# Patient Record
Sex: Male | Born: 1970 | Race: White | Hispanic: No | Marital: Single | Smoking: Never smoker
Health system: Southern US, Community
[De-identification: ages and names within clinical notes are randomized; demographics above are authoritative.]

---

## 2016-12-17 ENCOUNTER — Encounter (HOSPITAL_COMMUNITY): Payer: Self-pay | Admitting: Emergency Medicine

## 2016-12-17 ENCOUNTER — Observation Stay (HOSPITAL_COMMUNITY): Payer: Self-pay

## 2016-12-17 ENCOUNTER — Inpatient Hospital Stay (HOSPITAL_COMMUNITY)
Admission: EM | Admit: 2016-12-17 | Discharge: 2016-12-18 | DRG: 917 | Discharge status: Against Medical Advice | Payer: Self-pay | Attending: Pulmonary Disease | Admitting: Pulmonary Disease

## 2016-12-17 ENCOUNTER — Emergency Department (HOSPITAL_COMMUNITY): Payer: Self-pay

## 2016-12-17 DIAGNOSIS — T402X1A Poisoning by other opioids, accidental (unintentional), initial encounter: Secondary | ICD-10-CM

## 2016-12-17 DIAGNOSIS — R0681 Apnea, not elsewhere classified: Secondary | ICD-10-CM | POA: Diagnosis present

## 2016-12-17 DIAGNOSIS — T40601A Poisoning by unspecified narcotics, accidental (unintentional), initial encounter: Secondary | ICD-10-CM | POA: Diagnosis present

## 2016-12-17 DIAGNOSIS — N289 Disorder of kidney and ureter, unspecified: Secondary | ICD-10-CM | POA: Diagnosis present

## 2016-12-17 DIAGNOSIS — G92 Toxic encephalopathy: Secondary | ICD-10-CM | POA: Diagnosis present

## 2016-12-17 DIAGNOSIS — F1010 Alcohol abuse, uncomplicated: Secondary | ICD-10-CM | POA: Diagnosis present

## 2016-12-17 DIAGNOSIS — R739 Hyperglycemia, unspecified: Secondary | ICD-10-CM | POA: Diagnosis present

## 2016-12-17 DIAGNOSIS — T401X1A Poisoning by heroin, accidental (unintentional), initial encounter: Principal | ICD-10-CM | POA: Diagnosis present

## 2016-12-17 LAB — RAPID URINE DRUG SCREEN, HOSP PERFORMED
Amphetamines: POSITIVE — AB
BENZODIAZEPINES: NOT DETECTED
Barbiturates: NOT DETECTED
COCAINE: POSITIVE — AB
OPIATES: POSITIVE — AB
Tetrahydrocannabinol: NOT DETECTED

## 2016-12-17 LAB — COMPREHENSIVE METABOLIC PANEL
ALBUMIN: 3.8 g/dL (ref 3.5–5.0)
ALT: 262 U/L — AB (ref 17–63)
AST: 414 U/L — AB (ref 15–41)
Alkaline Phosphatase: 86 U/L (ref 38–126)
Anion gap: 15 (ref 5–15)
BUN: 9 mg/dL (ref 6–20)
CHLORIDE: 98 mmol/L — AB (ref 101–111)
CO2: 22 mmol/L (ref 22–32)
CREATININE: 1.32 mg/dL — AB (ref 0.61–1.24)
Calcium: 8.7 mg/dL — ABNORMAL LOW (ref 8.9–10.3)
GFR calc Af Amer: >60 mL/min (ref >60)
GLUCOSE: 203 mg/dL — AB (ref 65–99)
POTASSIUM: 3.1 mmol/L — AB (ref 3.5–5.1)
Sodium: 135 mmol/L (ref 135–145)
Total Bilirubin: 0.4 mg/dL (ref 0.3–1.2)
Total Protein: 7 g/dL (ref 6.5–8.1)

## 2016-12-17 LAB — GLUCOSE, CAPILLARY
GLUCOSE-CAPILLARY: 97 mg/dL (ref 65–99)
GLUCOSE-CAPILLARY: 102 mg/dL — AB (ref 65–99)
GLUCOSE-CAPILLARY: 123 mg/dL — AB (ref 65–99)
Glucose-Capillary: 167 mg/dL — ABNORMAL HIGH (ref 65–99)

## 2016-12-17 LAB — CBC WITH DIFFERENTIAL/PLATELET
BASOS ABS: 0 10*3/uL (ref 0.0–0.1)
BASOS PCT: 0 %
EOS PCT: 0 %
Eosinophils Absolute: 0.1 10*3/uL (ref 0.0–0.7)
HEMATOCRIT: 49.9 % (ref 39.0–52.0)
Hemoglobin: 16.7 g/dL (ref 13.0–17.0)
LYMPHS PCT: 7 %
Lymphs Abs: 1.2 10*3/uL (ref 0.7–4.0)
MCH: 32.6 pg (ref 26.0–34.0)
MCHC: 33.5 g/dL (ref 30.0–36.0)
MCV: 97.3 fL (ref 78.0–100.0)
Monocytes Absolute: 0.4 10*3/uL (ref 0.1–1.0)
Monocytes Relative: 3 %
NEUTROS ABS: 15.1 10*3/uL — AB (ref 1.7–7.7)
Neutrophils Relative %: 90 %
PLATELETS: 381 10*3/uL (ref 150–400)
RBC: 5.13 MIL/uL (ref 4.22–5.81)
RDW: 12.7 % (ref 11.5–15.5)
WBC: 16.8 10*3/uL — AB (ref 4.0–10.5)

## 2016-12-17 LAB — I-STAT CHEM 8, ED
BUN: 9 mg/dL (ref 6–20)
CREATININE: 1.2 mg/dL (ref 0.61–1.24)
Calcium, Ion: 1.07 mmol/L — ABNORMAL LOW (ref 1.15–1.40)
Chloride: 99 mmol/L — ABNORMAL LOW (ref 101–111)
Glucose, Bld: 221 mg/dL — ABNORMAL HIGH (ref 65–99)
HEMATOCRIT: 53 % — AB (ref 39.0–52.0)
Hemoglobin: 18 g/dL — ABNORMAL HIGH (ref 13.0–17.0)
POTASSIUM: 3.4 mmol/L — AB (ref 3.5–5.1)
SODIUM: 139 mmol/L (ref 135–145)
TCO2: 26 mmol/L (ref 22–32)

## 2016-12-17 LAB — CBC
HEMATOCRIT: 48 % (ref 39.0–52.0)
HEMOGLOBIN: 16.5 g/dL (ref 13.0–17.0)
MCH: 33.1 pg (ref 26.0–34.0)
MCHC: 34.4 g/dL (ref 30.0–36.0)
MCV: 96.2 fL (ref 78.0–100.0)
Platelets: 282 10*3/uL (ref 150–400)
RBC: 4.99 MIL/uL (ref 4.22–5.81)
RDW: 12.8 % (ref 11.5–15.5)
WBC: 14.9 10*3/uL — ABNORMAL HIGH (ref 4.0–10.5)

## 2016-12-17 LAB — CREATININE, SERUM
Creatinine, Ser: 1.1 mg/dL (ref 0.61–1.24)
GFR calc Af Amer: >60 mL/min (ref >60)
GFR calc non Af Amer: >60 mL/min (ref >60)

## 2016-12-17 LAB — TROPONIN I
TROPONIN I: 0.05 ng/mL — AB (ref <0.03)
Troponin I: 0.05 ng/mL (ref <0.03)
Troponin I: 0.04 ng/mL (ref <0.03)

## 2016-12-17 LAB — ACETAMINOPHEN LEVEL: Acetaminophen (Tylenol), Serum: <10 ug/mL — ABNORMAL LOW (ref 10–30)

## 2016-12-17 LAB — CK: CK TOTAL: 282 U/L (ref 49–397)

## 2016-12-17 LAB — ETHANOL: Alcohol, Ethyl (B): 29 mg/dL — ABNORMAL HIGH (ref <10)

## 2016-12-17 LAB — SALICYLATE LEVEL

## 2016-12-17 MED ORDER — ENOXAPARIN SODIUM 40 MG/0.4ML ~~LOC~~ SOLN
40.0000 mg | Freq: Every day | SUBCUTANEOUS | Status: DC
  Administered 2016-12-17 – 2016-12-18 (×2): 40 mg via SUBCUTANEOUS
  Filled 2016-12-17 (×2): qty 0.4

## 2016-12-17 MED ORDER — SODIUM CHLORIDE 0.9 % IV SOLN: 250.0000 mL | INTRAVENOUS | Status: DC | PRN

## 2016-12-17 MED ORDER — THIAMINE HCL 100 MG/ML IJ SOLN
100.0000 mg | Freq: Every day | INTRAMUSCULAR | Status: DC
  Administered 2016-12-17: 100 mg via INTRAVENOUS
  Filled 2016-12-17: qty 1
  Filled 2016-12-17: qty 2

## 2016-12-17 MED ORDER — SODIUM CHLORIDE 0.9 % IV BOLUS (SEPSIS)
500.0000 mL | Freq: Once | INTRAVENOUS | Status: AC
  Administered 2016-12-17: 500 mL via INTRAVENOUS

## 2016-12-17 MED ORDER — ONDANSETRON HCL 4 MG/2ML IJ SOLN
INTRAMUSCULAR | Status: AC
  Filled 2016-12-17: qty 2

## 2016-12-17 MED ORDER — NALOXONE HCL 2 MG/2ML IJ SOSY
PREFILLED_SYRINGE | INTRAMUSCULAR | Status: AC
  Administered 2016-12-17: 06:00:00
  Filled 2016-12-17: qty 2

## 2016-12-17 MED ORDER — ONDANSETRON HCL 4 MG/2ML IJ SOLN: 4.0000 mg | Freq: Four times a day (QID) | INTRAMUSCULAR | Status: DC | PRN

## 2016-12-17 MED ORDER — POTASSIUM CHLORIDE 2 MEQ/ML IV SOLN
INTRAVENOUS | Status: DC
  Administered 2016-12-17 – 2016-12-18 (×2): via INTRAVENOUS
  Filled 2016-12-17 (×6): qty 1000

## 2016-12-17 MED ORDER — FAMOTIDINE IN NACL 20-0.9 MG/50ML-% IV SOLN
20.0000 mg | Freq: Two times a day (BID) | INTRAVENOUS | Status: DC
  Administered 2016-12-17 (×2): 20 mg via INTRAVENOUS
  Filled 2016-12-17 (×3): qty 50

## 2016-12-17 MED ORDER — NALOXONE HCL 0.4 MG/ML IJ SOLN
1.0000 mg/h | INTRAVENOUS | Status: DC
  Administered 2016-12-17: 1 mg/h via INTRAVENOUS
  Filled 2016-12-17: qty 10

## 2016-12-17 MED ORDER — INSULIN ASPART 100 UNIT/ML ~~LOC~~ SOLN
0.0000 [IU] | Freq: Three times a day (TID) | SUBCUTANEOUS | Status: DC
  Administered 2016-12-17: 2 [IU] via SUBCUTANEOUS

## 2016-12-17 MED ORDER — KETOROLAC TROMETHAMINE 30 MG/ML IJ SOLN
30.0000 mg | Freq: Once | INTRAMUSCULAR | Status: AC
  Administered 2016-12-17: 30 mg via INTRAVENOUS
  Filled 2016-12-17: qty 1

## 2016-12-17 NOTE — ED Triage Notes (Signed)
Pt arrives via EMS from home after overdose, friends called 911 after pt lost his pulses. Bystanders initiated CPR and PD continued. By the time EMS arrived pt had pulses was cyanotic and apneic. Quickly became alert and oriented. C/o sternal pain. Rhonchi lung sounds. Pt ambulatory to ED, alert at this time. Using bathroom.

## 2016-12-17 NOTE — ED Notes (Signed)
Patient placed on 2L [place].

## 2016-12-17 NOTE — ED Notes (Signed)
2mg Narcan given

## 2016-12-17 NOTE — H&P (Signed)
PULMONARY / CRITICAL CARE MEDICINE   Name: Brent Ortega MRN: [id number] DOB: [birth year]    ADMISSION DATE:  12/17/2016   CHIEF COMPLAINT: Decreased level of consciousness  HISTORY OF PRESENT ILLNESS:   This is a 46 year old who was snorting what he thought was heroin earlier today.  He became unresponsive and by report of bystanders pulseless.  Bystander CPR was performed.  He was still apneic when EMS arrived and he was given a total of 8 mg of Narcan and started on a Narcan infusion.  He is now awake and conversant.  He denies cough he denies chest pain.  He tells me that he is not a regular user and does not use needles.  He reports a concurrent use of approximately one 6 pack of beer per day.  PAST MEDICAL HISTORY :  He  has no past medical history on file.  He denies any significant past medical history PAST SURGICAL HISTORY: He  has no past surgical history on file.  He denies any previous surgeries  Not on File  No current facility-administered medications on file prior to encounter.    No current outpatient medications on file prior to encounter.    FAMILY HISTORY:  His has no family status information on file.    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  He denies smoking, he reports one 6 pack of beer per day, he says he is not a habitual user.    REVIEW OF SYSTEMS:   10 system review of systems is essentially negative, most notably he does not have a known history of glucose intolerance.  SUBJECTIVE:  As above  VITAL SIGNS: BP 114/72   Pulse 85   Resp 20   SpO2 95%     INTAKE / OUTPUT: No intake/output data recorded.  PHYSICAL EXAMINATION: General: This is a fit appearing 46 year old who is conversant with me.  Is suffering from nausea during my exam. Neuro: He is oriented x3, pupils are equal at 3 mm and EOMs are full, the face is symmetric.  He is moving all 4 spontaneously. HEENT: There is an old healed scar across  left face Cardiovascular: S1 and S2 are regular without murmur rub or gallop.  There is no JVD, there is no dependent edema. Lungs: Aspirations are unlabored, the lungs are clear Abdomen: The abdomen is soft without any organomegaly masses tenderness guarding or rebound  LABS:  BMET Recent Labs  Lab 12/17/16 0528  NA 139  K 3.4*  CL 99*  BUN 9  CREATININE 1.20  GLUCOSE 221*    Electrolytes No results for input(s): CALCIUM, MG, PHOS in the last 168 hours.  CBC Recent Labs  Lab 12/17/16 0528 12/17/16 0645  WBC  --  16.8*  HGB 18.0* 16.7  HCT 53.0* 49.9  PLT  --  381    Coag's No results for input(s): APTT, INR in the last 168 hours.  Sepsis Markers No results for input(s): LATICACIDVEN, PROCALCITON, O2SATVEN in the last 168 hours.  ABG No results for input(s): PHART, PCO2ART, PO2ART in the last 168 hours.  Liver Enzymes No results for input(s): AST, ALT, ALKPHOS, BILITOT, ALBUMIN in the last 168 hours.  Cardiac Enzymes No results for input(s): TROPONINI, PROBNP in the last 168 hours.  Glucose No results for input(s): GLUCAP in the last 168 hours.  Imaging Dg Chest 2 View  Result Date: 12/17/2016 CLINICAL DATA:  46 year old male status post CPR. EXAM: CHEST  2 VIEW  COMPARISON:  None. FINDINGS: The lungs are clear. There is no pleural effusion or pneumothorax. The cardiac silhouette is within normal limits. No acute osseous pathology. IMPRESSION: No active cardiopulmonary disease. Electronically Signed   By: Elgie CollardArash  Radparvar M.D.   On: 12/17/2016 06:28       DISCUSSION: This is a 46 year old who thought he was snorting heroin this morning.  He became unresponsive and CPR was performed by bystanders.  When EMS arrived apparently he had a pulse but was apneic and was given a total of 8 mg of Narcan in the field.  He awakened that has been maintained on a Narcan infusion.  Due to the co-ingestion of cocaine I will be obtaining a CT scan of the head as well as CT  or CK to rule out rhabdomyolysis.  Serial enzymes have been ordered  ASSESSMENT / PLAN:  PULMONARY A: Exam and chest x-ray do not suggest aspiration at the present time, we will remain vigilant for the possibility of a developing infiltrate.  CARDIOVASCULAR A: Tox screen indicates the co-ingestion of cocaine, there are no acute ischemic changes on the EKG, serial enzymes have been ordered.  ENDOCRINE A: He denies a history of glucose intolerance in the past but his glucose is elevated on his BMP today.  I have ordered an A1c and sliding scale insulin.  NEUROLOGIC A: At this point he appears to be neurologically intact however due to the co-ingestion of cocaine and altered mental status on presentation I have ordered a CT scan of the head.  I have decreased the rate of his Narcan infusion as he was appropriately interactive during my exam and suffering from nausea.   Greater than 32 minutes was spent in the care of this patient today  Pulmonary and Critical Care Medicine [facility] Pager: [phone]  12/17/2016, 8:21 AM

## 2016-12-17 NOTE — ED Provider Notes (Signed)
[facility] EMERGENCY DEPARTMENT Provider Note   CSN: [id number] Arrival date & time: 12/17/16  0446     History   Chief Complaint No chief complaint on file.   HPI Brent Ortega is a 46 y.o. male.  The history is provided by the patient. No language interpreter was used.  Ingestion  This is a new problem. The current episode started less than 1 hour ago. The problem occurs constantly. The problem has been resolved. Pertinent negatives include no abdominal pain, no headaches and no shortness of breath. Nothing aggravates the symptoms. Relieved by: 6 mg of narcan and bystander CPR. Treatments tried: narcan and Publishing copy. The treatment provided significant relief.    No past medical history on file.  There are no active problems to display for this patient.     Home Medications    Prior to Admission medications   Not on File    Family History No family history on file.  Social History Social History   Tobacco Use  . Smoking status: Not on file  Substance Use Topics  . Alcohol use: Not on file  . Drug use: Not on file     Allergies   Patient has no allergy information on record.   Review of Systems Review of Systems  Unable to perform ROS: Other (does not recall as was not responsive secondary to OD)  Respiratory: Negative for shortness of breath.   Gastrointestinal: Negative for abdominal pain.  Neurological: Negative for headaches.     Physical Exam Updated Vital Signs There were no vitals taken for this visit.  Physical Exam  Constitutional: He is oriented to person, place, and time. He appears well-developed and well-nourished. No distress.  HENT:  Head: Normocephalic and atraumatic.  Mouth/Throat: No oropharyngeal exudate.  Eyes: EOM are normal.  Pinpoint pupils   Neck: Normal range of motion. Neck supple. No JVD present.  Cardiovascular: Normal rate, regular rhythm, normal heart sounds and intact distal pulses.    Pulmonary/Chest: Effort normal and breath sounds normal. No respiratory distress. He has no rales.  Abdominal: Soft. Bowel sounds are normal. There is no tenderness.  Musculoskeletal: Normal range of motion.  Neurological: He is alert and oriented to person, place, and time. He displays normal reflexes.  Skin: Skin is warm. Capillary refill takes less than 2 seconds.  Psychiatric: His affect is angry.     ED Treatments / Results  Labs (all labs ordered are listed, but only abnormal results are displayed)  Results for orders placed or performed during the hospital encounter of 12/17/16  I-stat chem 8, ed  Result Value Ref Range   Sodium 139 135 - 145 mmol/L   Potassium 3.4 (L) 3.5 - 5.1 mmol/L   Chloride 99 (L) 101 - 111 mmol/L   BUN 9 6 - 20 mg/dL   Creatinine, Ser 4.09 0.61 - 1.24 mg/dL   Glucose, Bld 811 (H) 65 - 99 mg/dL   Calcium, Ion 9.14 (L) 1.15 - 1.40 mmol/L   TCO2 26 22 - 32 mmol/L   Hemoglobin 18.0 (H) 13.0 - 17.0 g/dL   HCT 78.2 (H) 95.6 - 21.3 %   No results found.  Radiology No results found.  Procedures Procedures (including critical care time)  Medications Ordered in ED Medications  ketorolac (TORADOL) 30 MG/ML injection 30 mg (not administered)  naloxone (NARCAN) 2 MG/2ML injection (  Given 12/17/16 0535)      Final Clinical Impressions(s) / ED Diagnoses   Patient  will not say what he ingested but given the amount of narcan he required at the scene to arouse and the amount in the ED necessary to keep the patient awake I suspect carfentanil ingestion.    He is now on a narcan drip and will require admission.    Signed out to Dr. Silverio LayYao.     Vannie Hochstetler, MD 12/17/16 [phone]

## 2016-12-17 NOTE — Progress Notes (Signed)
Dr. Molli KnockYacoub informed of lab values.

## 2016-12-17 NOTE — ED Notes (Signed)
Patient transported to CT 

## 2016-12-17 NOTE — ED Provider Notes (Signed)
  Physical Exam  BP 114/72   Pulse 85   Resp 20   SpO2 95%   Physical Exam  ED Course/Procedures     Procedures  MDM  Patient overdosed on heroin. Found altered and CPR started. Given 6 mg narcan by EMS and 2 mg in the ED. Now more sleepy. Pupils still small, minimally reactive. O2 88-89% on RA. Still protecting airway. Started on narcan drip. UDS + polysubstance and there is alcohol on board. Dr. Zollie BeckersWalter from critical care to admit.   CRITICAL CARE Performed by: Richardean Canalavid H Arhum Peeples   Total critical care time: 30 minutes  Critical care time was exclusive of separately billable procedures and treating other patients.  Critical care was necessary to treat or prevent imminent or life-threatening deterioration.  Critical care was time spent personally by me on the following activities: development of treatment plan with patient and/or surrogate as well as nursing, discussions with consultants, evaluation of patient's response to treatment, examination of patient, obtaining history from patient or surrogate, ordering and performing treatments and interventions, ordering and review of laboratory studies, ordering and review of radiographic studies, pulse oximetry and re-evaluation of patient's condition.       Charlynne PanderYao, Latoya Diskin Hsienta, MD 12/17/16 [phone]

## 2016-12-18 DIAGNOSIS — T40601D Poisoning by unspecified narcotics, accidental (unintentional), subsequent encounter: Secondary | ICD-10-CM

## 2016-12-18 LAB — BASIC METABOLIC PANEL WITH GFR
Anion gap: 5 (ref 5–15)
BUN: 15 mg/dL (ref 6–20)
CO2: 30 mmol/L (ref 22–32)
Calcium: 8.3 mg/dL — ABNORMAL LOW (ref 8.9–10.3)
Chloride: 102 mmol/L (ref 101–111)
Creatinine, Ser: 1.1 mg/dL (ref 0.61–1.24)
GFR calc Af Amer: >60 mL/min
GFR calc non Af Amer: >60 mL/min
Glucose, Bld: 110 mg/dL — ABNORMAL HIGH (ref 65–99)
Potassium: 3.9 mmol/L (ref 3.5–5.1)
Sodium: 137 mmol/L (ref 135–145)

## 2016-12-18 LAB — PHOSPHORUS: Phosphorus: 2.8 mg/dL (ref 2.5–4.6)

## 2016-12-18 LAB — GLUCOSE, CAPILLARY: Glucose-Capillary: 92 mg/dL (ref 65–99)

## 2016-12-18 LAB — CBC
HCT: 38.5 % — ABNORMAL LOW (ref 39.0–52.0)
Hemoglobin: 12.9 g/dL — ABNORMAL LOW (ref 13.0–17.0)
MCH: 32.7 pg (ref 26.0–34.0)
MCHC: 33.5 g/dL (ref 30.0–36.0)
MCV: 97.5 fL (ref 78.0–100.0)
Platelets: 255 10*3/uL (ref 150–400)
RBC: 3.95 MIL/uL — ABNORMAL LOW (ref 4.22–5.81)
RDW: 13.1 % (ref 11.5–15.5)
WBC: 13.6 10*3/uL — ABNORMAL HIGH (ref 4.0–10.5)

## 2016-12-18 LAB — HEMOGLOBIN A1C
Hgb A1c MFr Bld: 5.2 % (ref 4.8–5.6)
Mean Plasma Glucose: 102.54 mg/dL

## 2016-12-18 LAB — MRSA PCR SCREENING: MRSA BY PCR: NEGATIVE

## 2016-12-18 LAB — HIV ANTIBODY (ROUTINE TESTING W REFLEX): HIV SCREEN 4TH GENERATION: NONREACTIVE

## 2016-12-18 LAB — MAGNESIUM: Magnesium: 1.8 mg/dL (ref 1.7–2.4)

## 2016-12-18 MED ORDER — VITAMIN B-1 100 MG PO TABS
100.0000 mg | ORAL_TABLET | Freq: Every day | ORAL | Status: DC
  Administered 2016-12-18: 100 mg via ORAL
  Filled 2016-12-18: qty 1

## 2016-12-18 MED ORDER — FAMOTIDINE 20 MG PO TABS
20.0000 mg | ORAL_TABLET | Freq: Two times a day (BID) | ORAL | Status: DC
  Administered 2016-12-18: 20 mg via ORAL
  Filled 2016-12-18: qty 1

## 2016-12-18 MED ORDER — FOLIC ACID 5 MG/ML IJ SOLN
1.0000 mg | Freq: Every day | INTRAMUSCULAR | Status: DC
  Filled 2016-12-18: qty 0.2

## 2016-12-18 MED ORDER — FOLIC ACID 1 MG PO TABS
1.0000 mg | ORAL_TABLET | Freq: Every day | ORAL | Status: DC
  Administered 2016-12-18: 1 mg via ORAL
  Filled 2016-12-18: qty 1

## 2016-12-18 MED ORDER — LORAZEPAM 2 MG/ML IJ SOLN
1.0000 mg | Freq: Once | INTRAMUSCULAR | Status: AC
  Administered 2016-12-18: 1 mg via INTRAVENOUS
  Filled 2016-12-18: qty 1

## 2016-12-18 NOTE — Progress Notes (Signed)
PULMONARY / CRITICAL CARE MEDICINE   Name: Lucille PassyKevin M Mabee MRN: [id number] DOB: [birth year]    ADMISSION DATE:  12/17/2016   CHIEF COMPLAINT: Decreased level of consciousness  HISTORY OF PRESENT ILLNESS:   This is a 46 year old who was snorting what he thought was heroin earlier today.  He became unresponsive and by report of bystanders pulseless.  Bystander CPR was performed.  He was still apneic when EMS arrived and he was given a total of 8 mg of Narcan and started on a Narcan infusion.  He is now awake and conversant.  He denies cough he denies chest pain.  He tells me that he is not a regular user and does not use needles.  He reports a concurrent use of approximately one 6 pack of beer per day.   SUBJECTIVE:  Narcan weaned to off  VITAL SIGNS: BP 116/66   Pulse 95   Temp 99 F (37.2 C) (Oral)   Resp (!) 24   Wt 68.3 kg (150 lb 9.2 oz)   SpO2 95%     INTAKE / OUTPUT: I/O last 3 completed shifts: In: 3228.2 [P.O.:340; I.V.:2283.2; IV Piggyback:605] Out: -   PHYSICAL EXAMINATION: General: Awake, a bit drowsy but well-appearing Neuro: Well oriented, answers questions appropriately, no tremor, denies hallucinations, follows commands HEENT: Oropharynx clear, pupils equal Cardiovascular: Heart regular without any murmurs Lungs: Clear bilaterally, no wheezing, no crackles Abdomen:, Benign, positive bowel sounds  LABS:  BMET Recent Labs  Lab 12/17/16 0528 12/17/16 0645 12/17/16 1015 12/18/16 0448  NA 139 135  --  137  K 3.4* 3.1*  --  3.9  CL 99* 98*  --  102  CO2  --  22  --  30  BUN 9 9  --  15  CREATININE 1.20 1.32* 1.10 1.10  GLUCOSE 221* 203*  --  110*    Electrolytes Recent Labs  Lab 12/17/16 0645 12/18/16 0448  CALCIUM 8.7* 8.3*  MG  --  1.8  PHOS  --  2.8    CBC Recent Labs  Lab 12/17/16 0645 12/17/16 1015 12/18/16 0448  WBC 16.8* 14.9* 13.6*  HGB 16.7 16.5 12.9*  HCT 49.9 48.0 38.5*  PLT 381 282 255    Coag's No results for  input(s): APTT, INR in the last 168 hours.  Sepsis Markers No results for input(s): LATICACIDVEN, PROCALCITON, O2SATVEN in the last 168 hours.  ABG No results for input(s): PHART, PCO2ART, PO2ART in the last 168 hours.  Liver Enzymes Recent Labs  Lab 12/17/16 0645  AST 414*  ALT 262*  ALKPHOS 86  BILITOT 0.4  ALBUMIN 3.8    Cardiac Enzymes Recent Labs  Lab 12/17/16 1015 12/17/16 1332 12/17/16 1948  TROPONINI 0.05* 0.05* 0.04*    Glucose Recent Labs  Lab 12/17/16 1029 12/17/16 1121 12/17/16 1528 12/17/16 2011 12/18/16 1008  GLUCAP 97 102* 167* 123* 92    Imaging No results found.     DISCUSSION: This is a 46 year old who thought he was snorting heroin.  He became unresponsive and CPR was performed by bystanders.  When EMS arrived apparently he had a pulse but was apneic and was given a total of 8 mg of Narcan in the field.  He awakened that has been maintained on a Narcan infusion.  Also positive for cocaine  ASSESSMENT / PLAN:  PULMONARY A: No evidence current pneumonia Follow clinically, pulmonary hygiene.  RENAL: A: Acute renal insufficiency, suspect hypoperfusion, resolved Follow urine output, BMP  CARDIOVASCULAR A: No evidence  for acute MI  ENDOCRINE A: Hyperglycemia, resolved  NEUROLOGIC A: Acute encephalopathy due to substance use and intoxication History of alcohol abuse No longer under the effects of either the cocaine or his narcotics Follow closely for any evidence of alcohol withdrawal before discharge   Move to floor bed. Anticipate that he will d/c home 12/11  Levy Pupaobert Jamone Garrido, MD, PhD 12/18/2016, 1:44 PM [facility] [phone] or if no answer [phone]

## 2016-12-18 NOTE — Progress Notes (Signed)
Patient signed AMA, IV's taken out, and informed that if he leaves he will have to pay the entire bill. Patient is aware and competent to make decisions. Will notify the provider. Huntley EstelleLewis, Ritik Stavola E, RN 12/18/2016 2:35 PM

## 2018-09-23 ENCOUNTER — Encounter (HOSPITAL_COMMUNITY): Payer: Self-pay | Admitting: Emergency Medicine

## 2018-09-23 ENCOUNTER — Emergency Department (HOSPITAL_COMMUNITY)
Admission: EM | Admit: 2018-09-23 | Discharge: 2018-09-23 | Disposition: A | Discharge status: Home / Self Care | Payer: Self-pay | Attending: Emergency Medicine | Admitting: Emergency Medicine

## 2018-09-23 ENCOUNTER — Other Ambulatory Visit: Payer: Self-pay

## 2018-09-23 DIAGNOSIS — R55 Syncope and collapse: Secondary | ICD-10-CM | POA: Insufficient documentation

## 2018-09-23 DIAGNOSIS — R410 Disorientation, unspecified: Secondary | ICD-10-CM | POA: Insufficient documentation

## 2018-09-23 LAB — RAPID URINE DRUG SCREEN, HOSP PERFORMED
Amphetamines: POSITIVE — AB
Barbiturates: NOT DETECTED
Benzodiazepines: NOT DETECTED
Cocaine: NOT DETECTED
Opiates: NOT DETECTED
Tetrahydrocannabinol: NOT DETECTED

## 2018-09-23 LAB — BASIC METABOLIC PANEL
Anion gap: 10 (ref 5–15)
BUN: 23 mg/dL — ABNORMAL HIGH (ref 6–20)
CO2: 26 mmol/L (ref 22–32)
Calcium: 8.7 mg/dL — ABNORMAL LOW (ref 8.9–10.3)
Chloride: 103 mmol/L (ref 98–111)
Creatinine, Ser: 0.88 mg/dL (ref 0.61–1.24)
GFR calc Af Amer: >60 mL/min (ref >60)
GFR calc non Af Amer: >60 mL/min (ref >60)
Glucose, Bld: 94 mg/dL (ref 70–99)
Potassium: 3.9 mmol/L (ref 3.5–5.1)
Sodium: 139 mmol/L (ref 135–145)

## 2018-09-23 LAB — CBC WITH DIFFERENTIAL/PLATELET
Abs Immature Granulocytes: 0.03 10*3/uL (ref 0.00–0.07)
Basophils Absolute: 0.1 10*3/uL (ref 0.0–0.1)
Basophils Relative: 1 %
Eosinophils Absolute: 0.2 10*3/uL (ref 0.0–0.5)
Eosinophils Relative: 2 %
HCT: 45.5 % (ref 39.0–52.0)
Hemoglobin: 14.8 g/dL (ref 13.0–17.0)
Immature Granulocytes: 0 %
Lymphocytes Relative: 20 %
Lymphs Abs: 1.6 10*3/uL (ref 0.7–4.0)
MCH: 30.7 pg (ref 26.0–34.0)
MCHC: 32.5 g/dL (ref 30.0–36.0)
MCV: 94.4 fL (ref 80.0–100.0)
Monocytes Absolute: 0.9 10*3/uL (ref 0.1–1.0)
Monocytes Relative: 11 %
Neutro Abs: 5.1 10*3/uL (ref 1.7–7.7)
Neutrophils Relative %: 66 %
Platelets: 308 10*3/uL (ref 150–400)
RBC: 4.82 MIL/uL (ref 4.22–5.81)
RDW: 12.5 % (ref 11.5–15.5)
WBC: 7.7 10*3/uL (ref 4.0–10.5)
nRBC: 0 % (ref 0.0–0.2)

## 2018-09-23 LAB — URINALYSIS, ROUTINE W REFLEX MICROSCOPIC
Bilirubin Urine: NEGATIVE
Glucose, UA: NEGATIVE mg/dL
Hgb urine dipstick: NEGATIVE
Ketones, ur: NEGATIVE mg/dL
Leukocytes,Ua: NEGATIVE
Nitrite: NEGATIVE
Protein, ur: NEGATIVE mg/dL
Specific Gravity, Urine: 1.018 (ref 1.005–1.030)
pH: 6 (ref 5.0–8.0)

## 2018-09-23 MED ORDER — SODIUM CHLORIDE 0.9 % IV BOLUS
1000.0000 mL | Freq: Once | INTRAVENOUS | Status: AC
  Administered 2018-09-23: 07:00:00 1000 mL via INTRAVENOUS

## 2018-09-23 NOTE — Discharge Instructions (Addendum)
Your lab work was unremarkable today.  Follow-up with wellness center as discussed.  Please try to eliminate any alcohol or drug use as this may be causing some your symptoms.  Please return to the emergency department if symptoms worsen.

## 2018-09-23 NOTE — ED Triage Notes (Signed)
Pt reports to using methamphetamines within the last several days when "blackouts occur"

## 2018-09-23 NOTE — ED Provider Notes (Signed)
[facility]-EMERGENCY DEPT Provider Note   CSN: [id number] Arrival date & time: 09/23/18  8676     History   Chief Complaint Chief Complaint  Patient presents with  . Near Syncope    HPI Brent Ortega is a 48 y.o. male.     The history is provided by the patient.  Near Syncope This is a new problem. The current episode started more than 1 week ago. The problem occurs rarely. The problem has been resolved. Pertinent negatives include no chest pain, no abdominal pain, no headaches and no shortness of breath. Nothing aggravates the symptoms. Nothing relieves the symptoms. He has tried rest for the symptoms. The treatment provided significant relief.    No past medical history on file.  Patient Active Problem List   Diagnosis Date Noted  . Opiate overdose (HCC) 12/17/2016    No past surgical history on file.      Home Medications    Prior to Admission medications   Not on File    Family History No family history on file.  Social History Social History   Tobacco Use  . Smoking status: Never Smoker  . Smokeless tobacco: Never Used  Substance Use Topics  . Alcohol use: Yes  . Drug use: Yes    Types: Marijuana, Methamphetamines    Comment: last use 3-4 days ago     Allergies   Patient has no known allergies.   Review of Systems Review of Systems  Constitutional: Negative for chills and fever.  HENT: Negative for ear pain and sore throat.   Eyes: Negative for pain and visual disturbance.  Respiratory: Negative for cough and shortness of breath.   Cardiovascular: Positive for near-syncope. Negative for chest pain and palpitations.  Gastrointestinal: Negative for abdominal pain and vomiting.  Genitourinary: Negative for dysuria and hematuria.  Musculoskeletal: Negative for arthralgias and back pain.  Skin: Negative for color change and rash.  Neurological: Positive for syncope. Negative for tremors, seizures, weakness and headaches.   Psychiatric/Behavioral: Positive for confusion (at times, resolved now).  All other systems reviewed and are negative.    Physical Exam Updated Vital Signs  ED Triage Vitals [09/23/18 0651]  Enc Vitals Group     BP 121/82     Pulse Rate 72     Resp 18     Temp 97.8 F (36.6 C)     Temp Source Oral     SpO2 100 %     Weight 150 lb (68 kg)     Height 5\' 10"  (1.778 m)     Head Circumference      Peak Flow      Pain Score      Pain Loc      Pain Edu?      Excl. in GC?     Physical Exam Vitals signs and nursing note reviewed.  Constitutional:      General: He is not in acute distress.    Appearance: He is well-developed. He is not ill-appearing.  HENT:     Head: Normocephalic and atraumatic.     Nose: Nose normal.     Mouth/Throat:     Mouth: Mucous membranes are moist.  Eyes:     Extraocular Movements: Extraocular movements intact.     Conjunctiva/sclera: Conjunctivae normal.     Pupils: Pupils are equal, round, and reactive to light.  Neck:     Musculoskeletal: Normal range of motion and neck supple.  Cardiovascular:  Rate and Rhythm: Normal rate and regular rhythm.     Pulses: Normal pulses.     Heart sounds: Normal heart sounds. No murmur.  Pulmonary:     Effort: Pulmonary effort is normal. No respiratory distress.     Breath sounds: Normal breath sounds.  Abdominal:     Palpations: Abdomen is soft.     Tenderness: There is no abdominal tenderness.  Skin:    General: Skin is warm and dry.  Neurological:     General: No focal deficit present.     Mental Status: He is alert and oriented to person, place, and time.     Cranial Nerves: No cranial nerve deficit.     Sensory: No sensory deficit.     Motor: No weakness.     Gait: Gait normal.     Comments: 5+ out of 5 strength throughout, normal sensation, no drift      ED Treatments / Results  Labs (all labs ordered are listed, but only abnormal results are displayed) Labs Reviewed  BASIC METABOLIC  PANEL - Abnormal; Notable for the following components:      Result Value   BUN 23 (*)    Calcium 8.7 (*)    All other components within normal limits  RAPID URINE DRUG SCREEN, HOSP PERFORMED - Abnormal; Notable for the following components:   Amphetamines POSITIVE (*)    All other components within normal limits  CBC WITH DIFFERENTIAL/PLATELET  URINALYSIS, ROUTINE W REFLEX MICROSCOPIC    EKG EKG Interpretation  Date/Time:  Monday September 23 2018 08:05:07 EDT Ventricular Rate:  60 PR Interval:    QRS Duration: 87 QT Interval:  432 QTC Calculation: 432 R Axis:   80 Text Interpretation:  Sinus rhythm Confirmed by Lennice Sites [phone]) on 09/23/2018 8:23:04 AM   Radiology No results found.  Procedures Procedures (including critical care time)  Medications Ordered in ED Medications  sodium chloride 0.9 % bolus 1,000 mL (0 mLs Intravenous Stopped 09/23/18 0911)     Initial Impression / Assessment and Plan / ED Course  I have reviewed the triage vital signs and the nursing notes.  Pertinent labs & imaging results that were available during my care of the patient were reviewed by me and considered in my medical decision making (see chart for details).     Brent Ortega is 48 year old male with history of polysubstance abuse who presents the ED with near syncope.  Patient with normal vitals.  No fever.  Patient states that over the last several weeks he has noticed some episodes of near loss of consciousness.  Confusion.  He has normal vitals.  Normal neurological exam.  He does not abuse daily alcohol or benzodiazepines.  No history of seizures.  Denies any seizure-like activity.  No recent trauma.  Overall suspect symptoms are secondary to substance abuse.  EKG shows sinus rhythm.  No ischemic changes.  Patient overall well-appearing.  No significant electrolyte abnormality, kidney injury, leukocytosis.  Doubt infectious process.  Overall given reassurance.  Educated about drug  abuse.  Given information to follow-up with primary care doctor.  Patient has not had any chest pain or shortness of breath.  Doubt ACS or PE.  Given return precautions and discharged in the ED in good condition.  This chart was dictated using voice recognition software.  Despite best efforts to proofread,  errors can occur which can change the documentation meaning.   Final Clinical Impressions(s) / ED Diagnoses   Final diagnoses:  Near  syncope    ED Discharge Orders    None       Virgina NorfolkCuratolo, Loomis Anacker, DO 09/23/18 [id number]

## 2018-09-23 NOTE — ED Triage Notes (Signed)
Pt reports having episodes of LOC for the last week where pt is losing track of time and feeling sore. Pt denies any hx of seizures.

## 2018-09-23 NOTE — ED Notes (Signed)
Patient given urinal and encouraged to void

## 2018-09-23 NOTE — ED Notes (Signed)
Dark green save tube sent to lab

## 2019-03-19 IMAGING — CT CT HEAD W/O CM
4 series · 17 of 47 positions shown, 19 images · non-contrast
Comparison: None.

CLINICAL DATA: 46-year-old male with altered level of
consciousness.

EXAM:
CT HEAD WITHOUT CONTRAST
TECHNIQUE: Contiguous axial images were obtained from the base of the skull
through the vertex without intravenous contrast.

[Series 3: head without · axial · non-contrast · 0.46mm/px · z∈[+1115,+1235]mm · 7 of 34 slices shown, 9 images]
[im 5/34  brain]
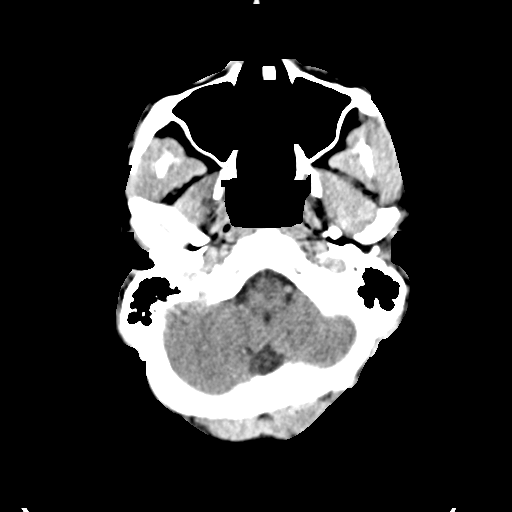
[im 5/34  bone]
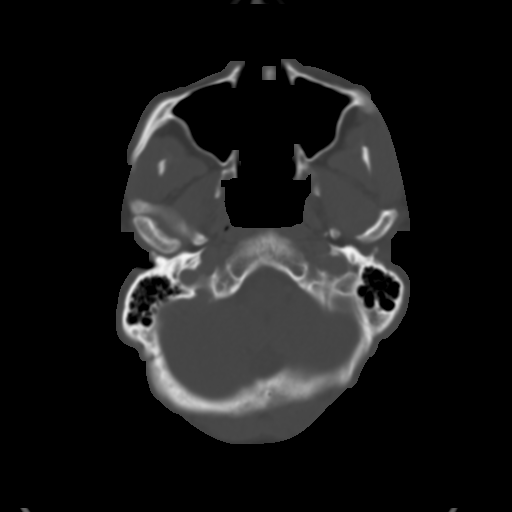
[im 9/34  brain]
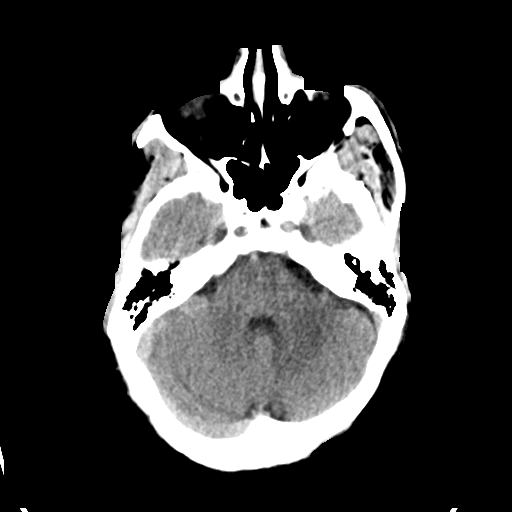
[im 13/34  brain]
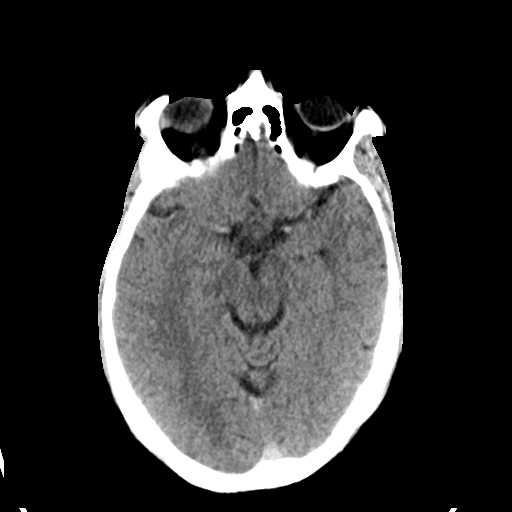
[im 17/34  brain]
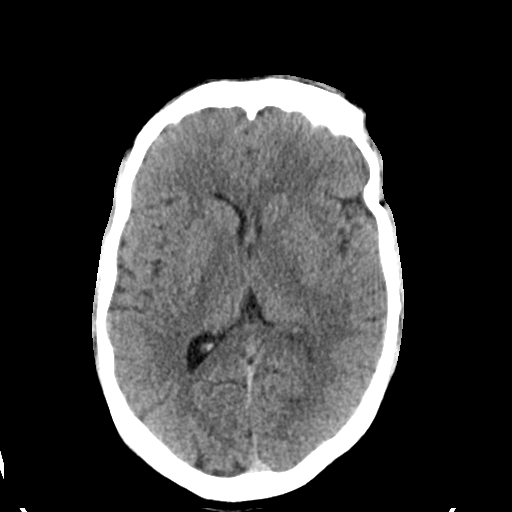
[im 21/34  brain]
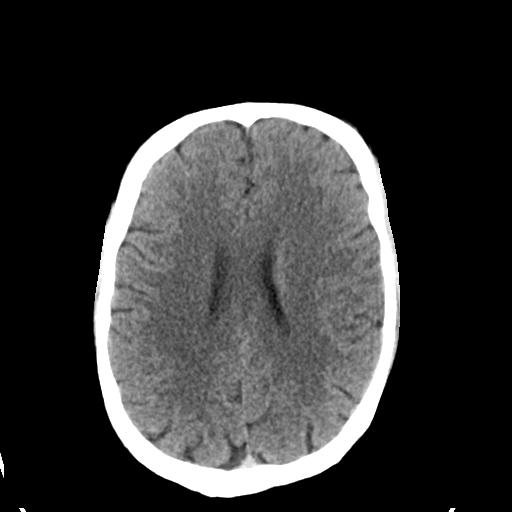
[im 21/34  bone]
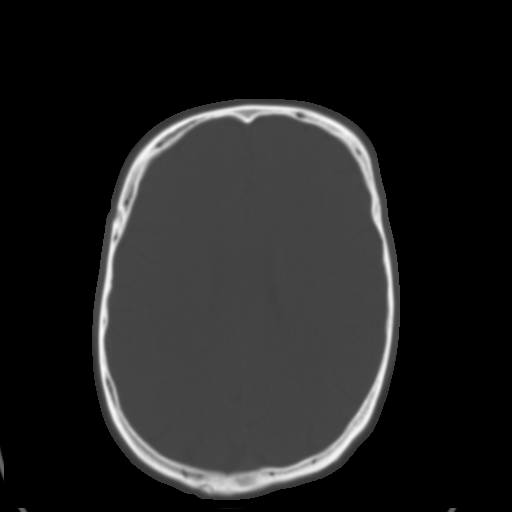
[im 25/34  brain]
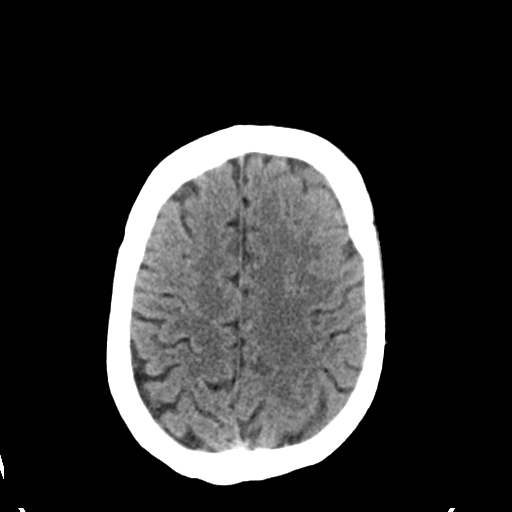
[im 29/34  brain]
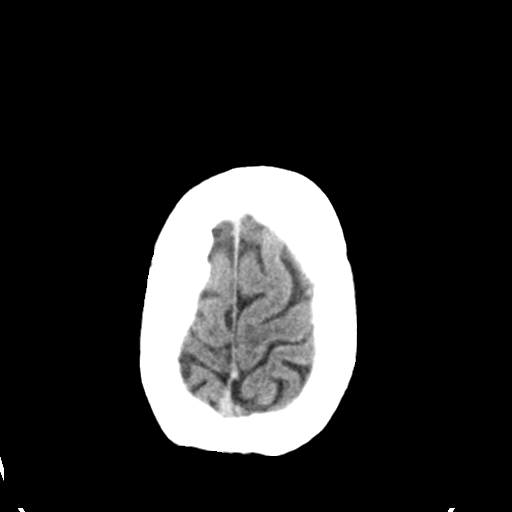

[Series 4: head bone · axial · 0.46mm/px · z∈[+1111,+1169]mm · 4 of 84 slices shown]
[im 9/84  bone]
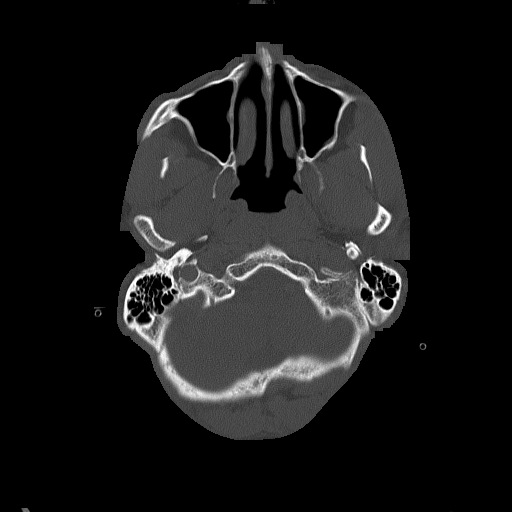
[im 17/84  bone]
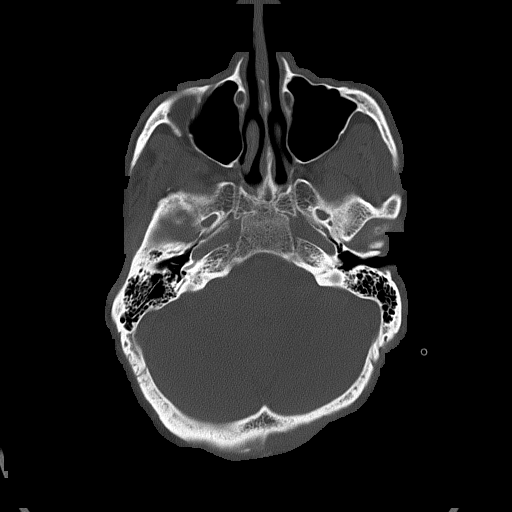
[im 25/84  bone]
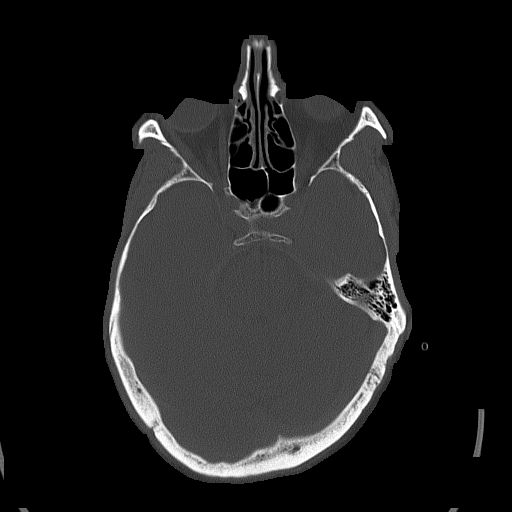
[im 38/84  bone]
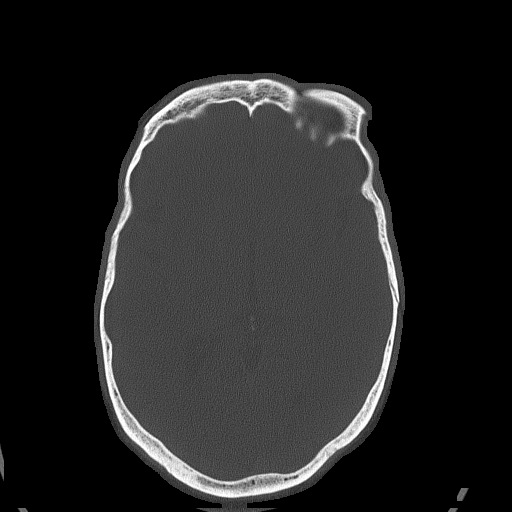

[Series 5: head without cor · coronal · non-contrast · 0.33mm/px · 3 of 71 slices shown]
[im 24/71  brain]
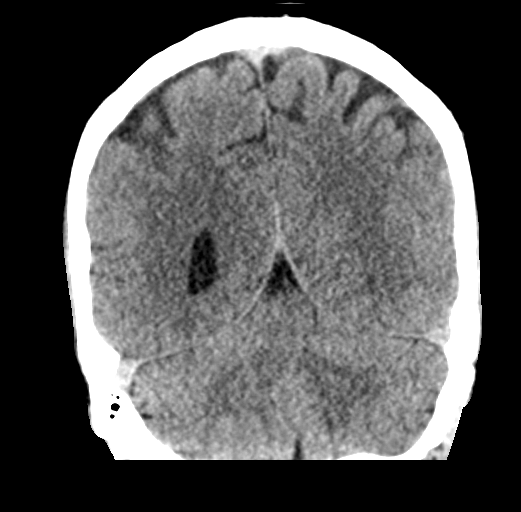
[im 32/71  brain]
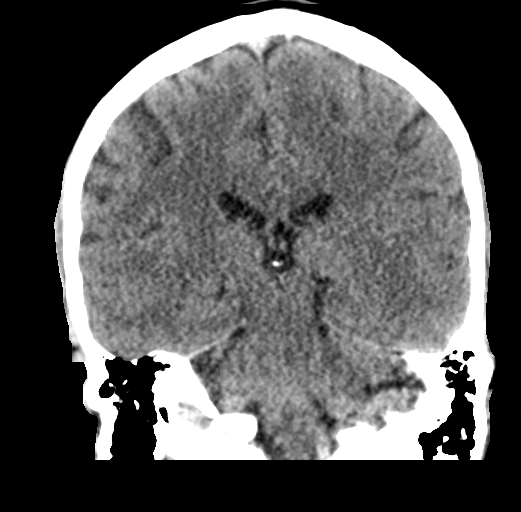
[im 39/71  brain]
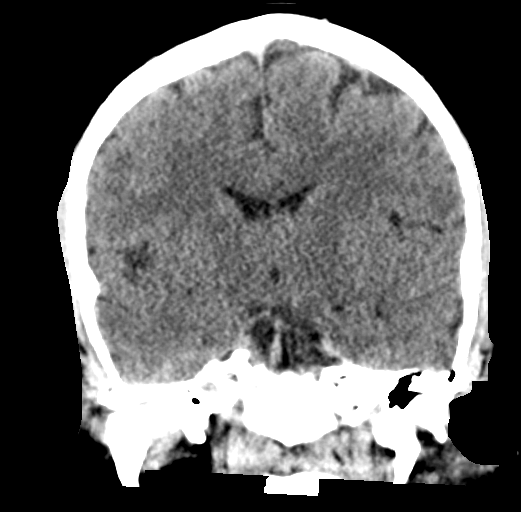

[Series 6: head without sag · sagittal · non-contrast · 0.33mm/px · 3 of 66 slices shown]
[im 22/66  brain]
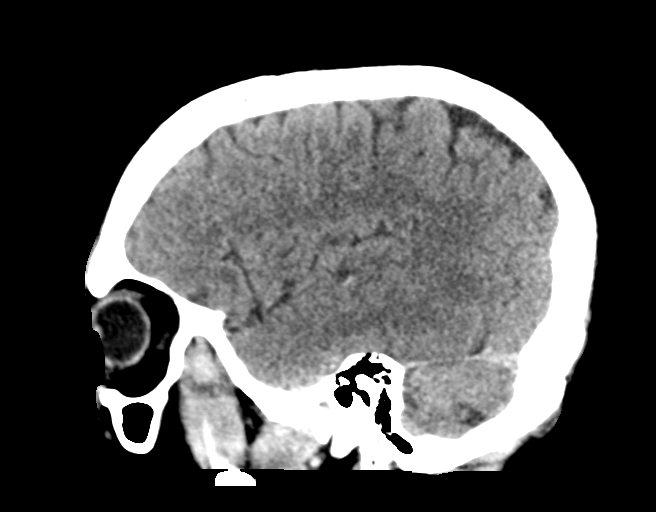
[im 33/66  brain]
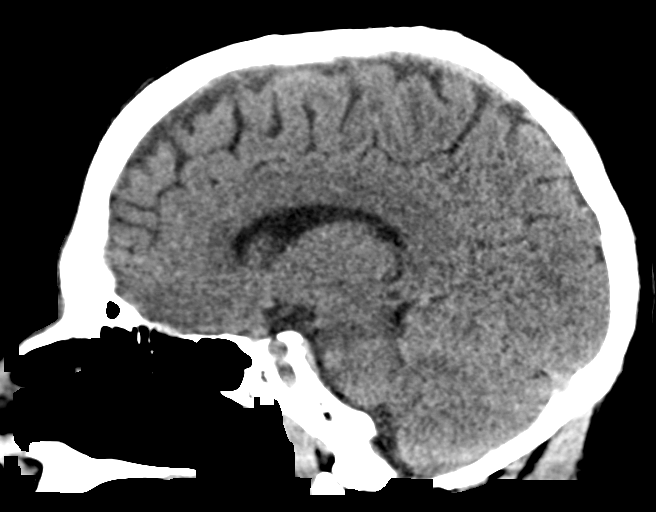
[im 44/66  brain]
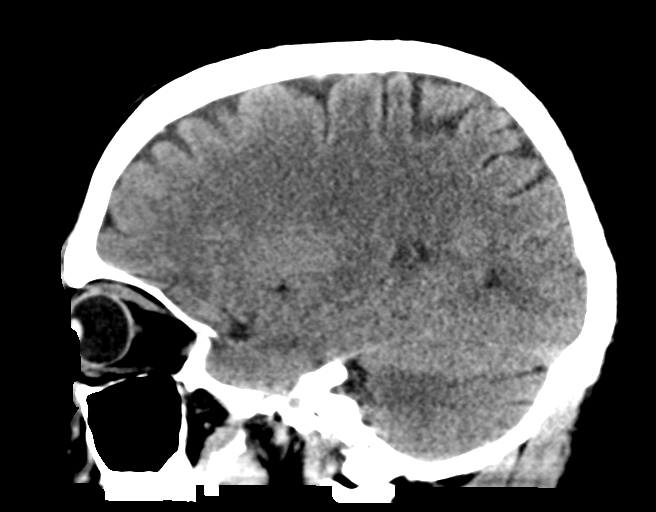

[17 of 47 positions shown; findings below may reference images not displayed]

FINDINGS: Brain: No evidence of acute infarction, hemorrhage, hydrocephalus,
extra-axial collection or mass lesion/mass effect.

Vascular: No hyperdense vessel or unexpected calcification.

Skull: Normal. Negative for fracture or focal lesion.

Sinuses/Orbits: Visualized paranasal sinuses and mastoid air cells
are clear. The orbits are symmetric and intact.

Other: None.
IMPRESSION: Normal CT evaluation of the head.

## 2019-03-19 IMAGING — DX DG CHEST 2V
2 series · 2 of 2 positions shown · non-contrast
Comparison: None.

CLINICAL DATA: 46-year-old male status post CPR.

EXAM:
CHEST  2 VIEW

[chest lat]
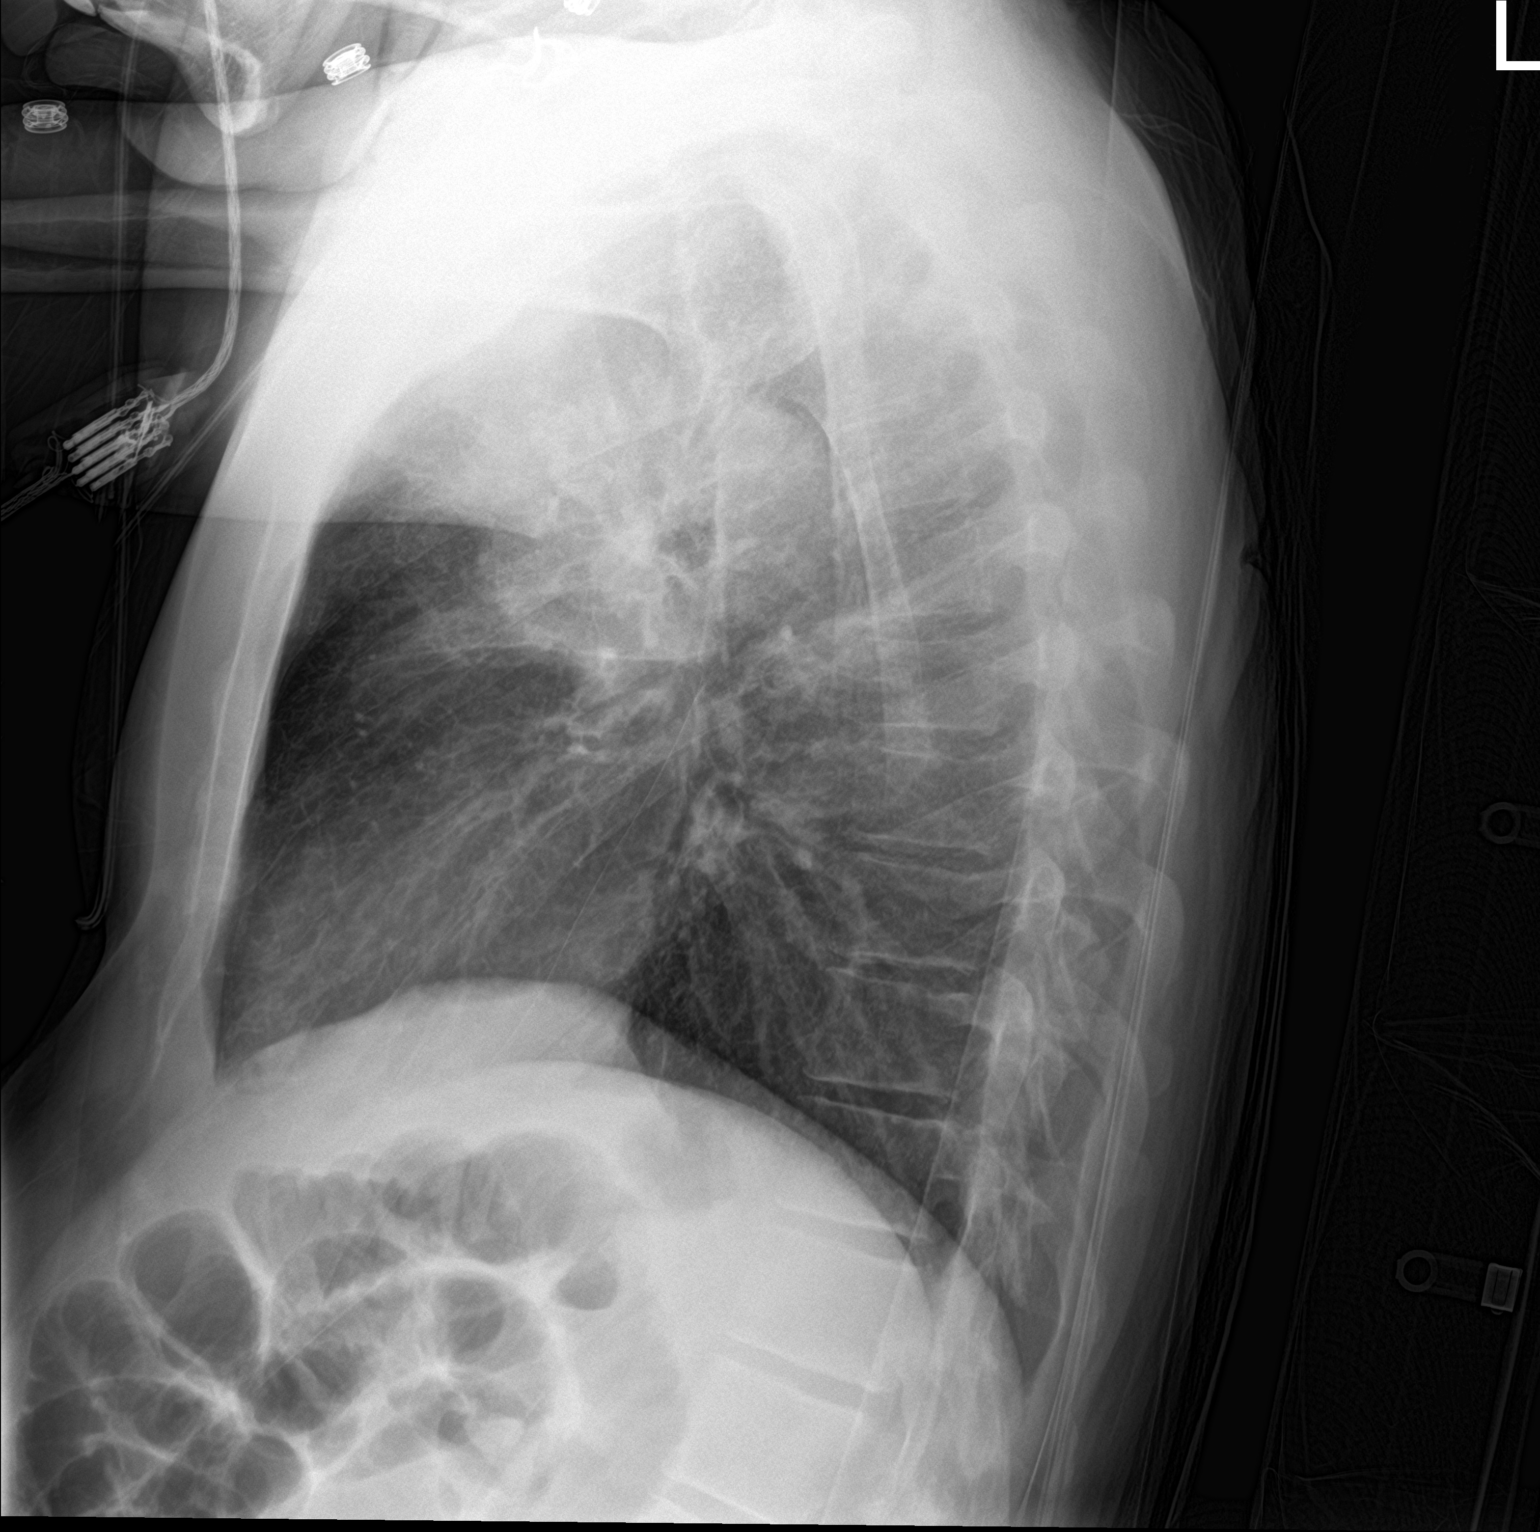

[chest ap]
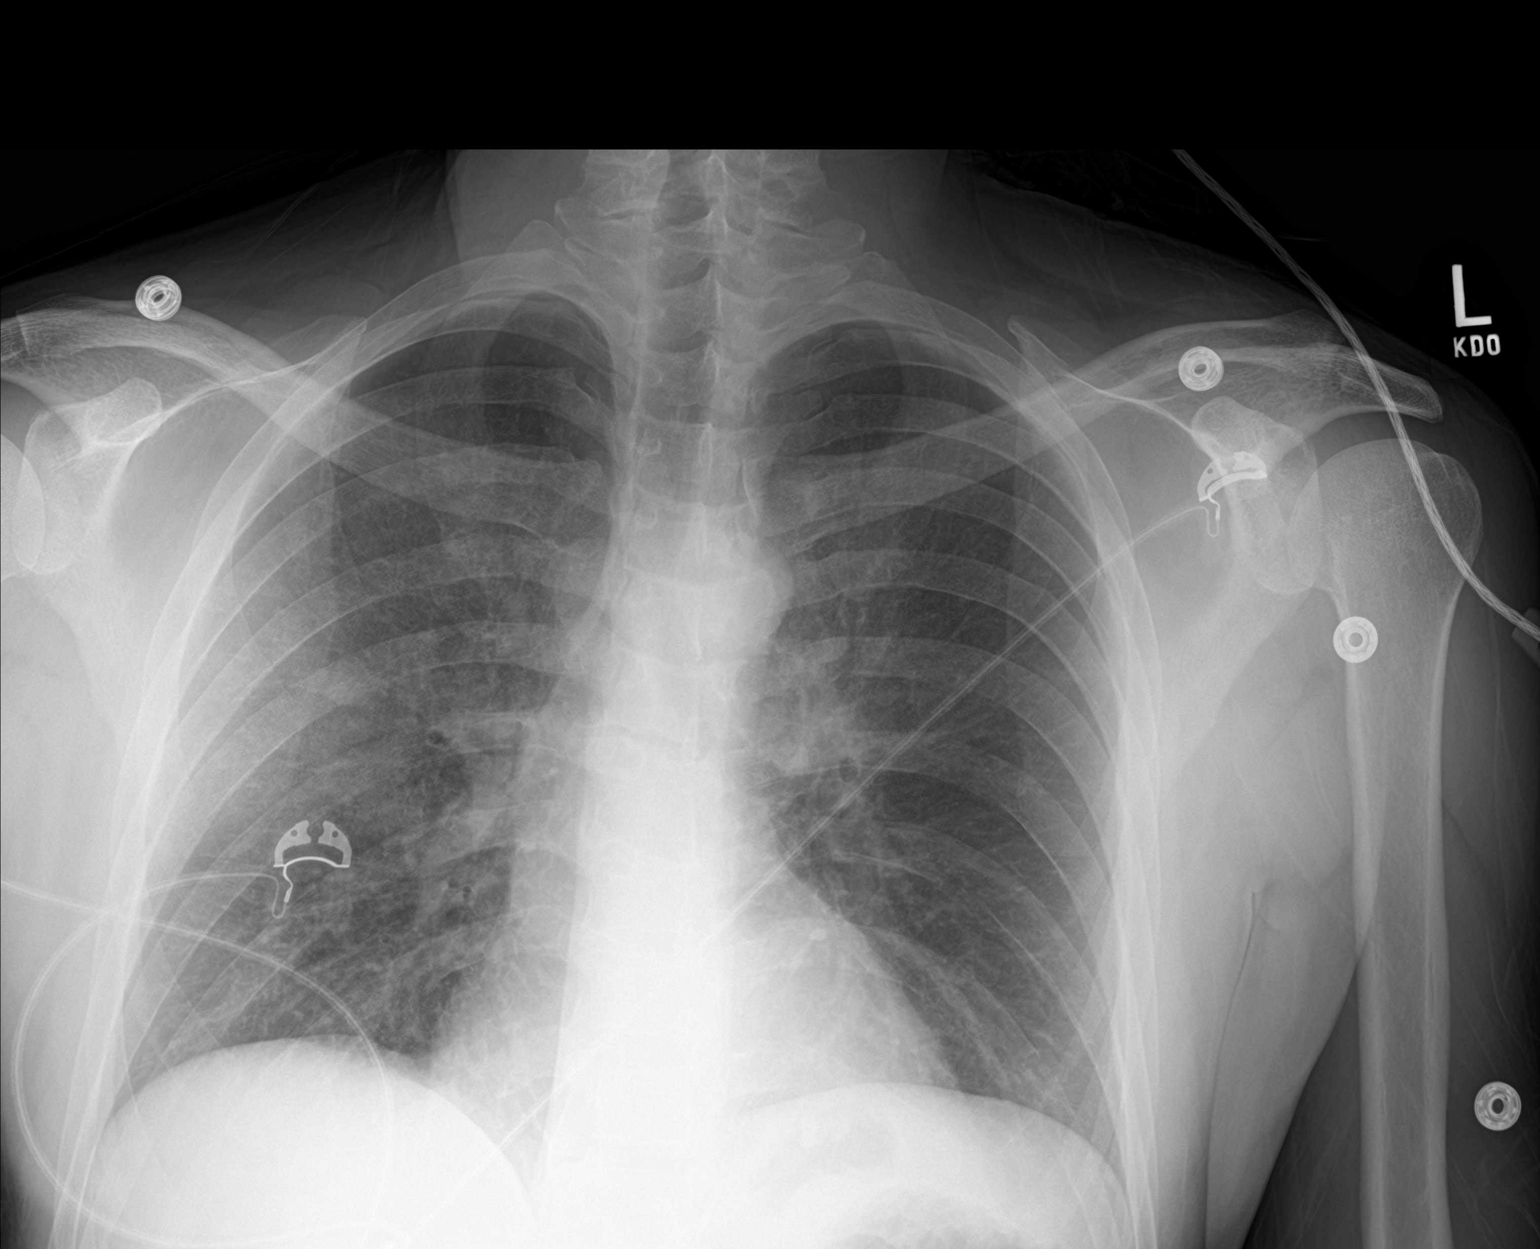

[2 of 2 positions shown; findings below may reference images not displayed]

FINDINGS: The lungs are clear. There is no pleural effusion or pneumothorax.
The cardiac silhouette is within normal limits. No acute osseous
pathology.
IMPRESSION: No active cardiopulmonary disease.

## 2020-08-17 ENCOUNTER — Encounter (HOSPITAL_COMMUNITY): Payer: Self-pay | Admitting: Emergency Medicine

## 2020-08-17 ENCOUNTER — Emergency Department (HOSPITAL_COMMUNITY)
Admission: EM | Admit: 2020-08-17 | Discharge: 2020-08-17 | Disposition: A | Discharge status: Home / Self Care | Payer: Self-pay | Attending: Emergency Medicine | Admitting: Emergency Medicine

## 2020-08-17 DIAGNOSIS — R4182 Altered mental status, unspecified: Secondary | ICD-10-CM | POA: Insufficient documentation

## 2020-08-17 DIAGNOSIS — R5383 Other fatigue: Secondary | ICD-10-CM | POA: Insufficient documentation

## 2020-08-17 DIAGNOSIS — E860 Dehydration: Secondary | ICD-10-CM | POA: Insufficient documentation

## 2020-08-17 DIAGNOSIS — Z5321 Procedure and treatment not carried out due to patient leaving prior to being seen by health care provider: Secondary | ICD-10-CM | POA: Insufficient documentation

## 2020-08-17 LAB — CBG MONITORING, ED: Glucose-Capillary: 89 mg/dL (ref 70–99)

## 2020-08-17 NOTE — ED Triage Notes (Signed)
Patient here from side of road reporting fatigued, dehydration. Reports being released from jail yesterday. Meth use today. HR 120s initially, 500 ml fluids given. Lethargic, but will respond to voice.

## 2020-08-17 NOTE — ED Notes (Signed)
Patient was seen going into the bathroom by room 18.  However, patient never returned from the bathroom.  Upon looking at patient bed, there is an IV that the patient removed laying on the bed.  Misty Stanley, RN and Lillia Abed, RN made aware that the patient had eloped.
